# Patient Record
Sex: Male | Born: 1945 | Race: Black or African American | Hispanic: No | Marital: Single | Smoking: Never smoker
Health system: Southern US, Community
[De-identification: ages and names within clinical notes are randomized; demographics above are authoritative.]

## PROBLEM LIST (undated history)

## (undated) DIAGNOSIS — N400 Enlarged prostate without lower urinary tract symptoms: Secondary | ICD-10-CM

## (undated) DIAGNOSIS — I10 Essential (primary) hypertension: Secondary | ICD-10-CM

## (undated) DIAGNOSIS — E119 Type 2 diabetes mellitus without complications: Secondary | ICD-10-CM

## (undated) DIAGNOSIS — M109 Gout, unspecified: Secondary | ICD-10-CM

## (undated) DIAGNOSIS — G20 Parkinson's disease: Secondary | ICD-10-CM

## (undated) DIAGNOSIS — G20A1 Parkinson's disease without dyskinesia, without mention of fluctuations: Secondary | ICD-10-CM

---

## 2015-07-04 DIAGNOSIS — I10 Essential (primary) hypertension: Secondary | ICD-10-CM | POA: Diagnosis present

## 2019-06-07 DIAGNOSIS — N400 Enlarged prostate without lower urinary tract symptoms: Secondary | ICD-10-CM | POA: Diagnosis present

## 2019-06-07 DIAGNOSIS — E118 Type 2 diabetes mellitus with unspecified complications: Secondary | ICD-10-CM | POA: Diagnosis present

## 2019-06-07 DIAGNOSIS — E785 Hyperlipidemia, unspecified: Secondary | ICD-10-CM | POA: Diagnosis present

## 2020-10-28 ENCOUNTER — Encounter: Payer: Self-pay | Admitting: Emergency Medicine

## 2020-10-28 ENCOUNTER — Other Ambulatory Visit: Payer: Self-pay

## 2020-10-28 ENCOUNTER — Inpatient Hospital Stay
Admission: EM | Admit: 2020-10-28 | Discharge: 2020-10-30 | DRG: 698 | Disposition: A | Discharge status: Skilled Nursing Facility | Payer: No Typology Code available for payment source | Source: Skilled Nursing Facility | Attending: Internal Medicine | Admitting: Internal Medicine

## 2020-10-28 ENCOUNTER — Emergency Department: Payer: No Typology Code available for payment source

## 2020-10-28 DIAGNOSIS — T83518A Infection and inflammatory reaction due to other urinary catheter, initial encounter: Secondary | ICD-10-CM | POA: Diagnosis present

## 2020-10-28 DIAGNOSIS — N400 Enlarged prostate without lower urinary tract symptoms: Secondary | ICD-10-CM | POA: Diagnosis present

## 2020-10-28 DIAGNOSIS — Y929 Unspecified place or not applicable: Secondary | ICD-10-CM

## 2020-10-28 DIAGNOSIS — R6520 Severe sepsis without septic shock: Secondary | ICD-10-CM | POA: Diagnosis present

## 2020-10-28 DIAGNOSIS — J189 Pneumonia, unspecified organism: Secondary | ICD-10-CM | POA: Diagnosis present

## 2020-10-28 DIAGNOSIS — A419 Sepsis, unspecified organism: Secondary | ICD-10-CM | POA: Diagnosis present

## 2020-10-28 DIAGNOSIS — E119 Type 2 diabetes mellitus without complications: Secondary | ICD-10-CM | POA: Diagnosis present

## 2020-10-28 DIAGNOSIS — Z885 Allergy status to narcotic agent status: Secondary | ICD-10-CM

## 2020-10-28 DIAGNOSIS — D72829 Elevated white blood cell count, unspecified: Secondary | ICD-10-CM

## 2020-10-28 DIAGNOSIS — Z20822 Contact with and (suspected) exposure to covid-19: Secondary | ICD-10-CM | POA: Diagnosis present

## 2020-10-28 DIAGNOSIS — R000 Tachycardia, unspecified: Secondary | ICD-10-CM

## 2020-10-28 DIAGNOSIS — E118 Type 2 diabetes mellitus with unspecified complications: Secondary | ICD-10-CM | POA: Diagnosis present

## 2020-10-28 DIAGNOSIS — M109 Gout, unspecified: Secondary | ICD-10-CM | POA: Diagnosis present

## 2020-10-28 DIAGNOSIS — G20 Parkinson's disease: Secondary | ICD-10-CM | POA: Diagnosis present

## 2020-10-28 DIAGNOSIS — N3000 Acute cystitis without hematuria: Secondary | ICD-10-CM | POA: Diagnosis present

## 2020-10-28 DIAGNOSIS — N390 Urinary tract infection, site not specified: Secondary | ICD-10-CM | POA: Diagnosis not present

## 2020-10-28 DIAGNOSIS — R0902 Hypoxemia: Secondary | ICD-10-CM | POA: Diagnosis present

## 2020-10-28 DIAGNOSIS — N179 Acute kidney failure, unspecified: Secondary | ICD-10-CM

## 2020-10-28 DIAGNOSIS — Z888 Allergy status to other drugs, medicaments and biological substances status: Secondary | ICD-10-CM

## 2020-10-28 DIAGNOSIS — E785 Hyperlipidemia, unspecified: Secondary | ICD-10-CM | POA: Diagnosis present

## 2020-10-28 DIAGNOSIS — I10 Essential (primary) hypertension: Secondary | ICD-10-CM | POA: Diagnosis present

## 2020-10-28 HISTORY — DX: Parkinson's disease without dyskinesia, without mention of fluctuations: G20.A1

## 2020-10-28 HISTORY — DX: Essential (primary) hypertension: I10

## 2020-10-28 HISTORY — DX: Benign prostatic hyperplasia without lower urinary tract symptoms: N40.0

## 2020-10-28 HISTORY — DX: Type 2 diabetes mellitus without complications: E11.9

## 2020-10-28 HISTORY — DX: Gout, unspecified: M10.9

## 2020-10-28 HISTORY — DX: Parkinson's disease: G20

## 2020-10-28 LAB — BASIC METABOLIC PANEL
Anion gap: 9 (ref 5–15)
BUN: 22 mg/dL (ref 8–23)
CO2: 23 mmol/L (ref 22–32)
Calcium: 9.8 mg/dL (ref 8.9–10.3)
Chloride: 106 mmol/L (ref 98–111)
Creatinine, Ser: 1.58 mg/dL — ABNORMAL HIGH (ref 0.61–1.24)
GFR, Estimated: 45 mL/min — ABNORMAL LOW (ref >60)
Glucose, Bld: 207 mg/dL — ABNORMAL HIGH (ref 70–99)
Potassium: 4 mmol/L (ref 3.5–5.1)
Sodium: 138 mmol/L (ref 135–145)

## 2020-10-28 LAB — URINALYSIS, COMPLETE (UACMP) WITH MICROSCOPIC
Bacteria, UA: NONE SEEN
Bilirubin Urine: NEGATIVE
Glucose, UA: NEGATIVE mg/dL
Ketones, ur: NEGATIVE mg/dL
Nitrite: NEGATIVE
Protein, ur: 30 mg/dL — AB
Specific Gravity, Urine: 1.018 (ref 1.005–1.030)
Squamous Epithelial / HPF: NONE SEEN (ref 0–5)
WBC, UA: >50 WBC/hpf — ABNORMAL HIGH (ref 0–5)
pH: 5 (ref 5.0–8.0)

## 2020-10-28 LAB — TROPONIN I (HIGH SENSITIVITY)
Troponin I (High Sensitivity): 28 ng/L — ABNORMAL HIGH (ref <18)
Troponin I (High Sensitivity): 27 ng/L — ABNORMAL HIGH (ref <18)

## 2020-10-28 LAB — CBG MONITORING, ED
Glucose-Capillary: 251 mg/dL — ABNORMAL HIGH (ref 70–99)
Glucose-Capillary: 178 mg/dL — ABNORMAL HIGH (ref 70–99)

## 2020-10-28 LAB — CREATININE, SERUM
Creatinine, Ser: 1.67 mg/dL — ABNORMAL HIGH (ref 0.61–1.24)
GFR, Estimated: 42 mL/min — ABNORMAL LOW (ref >60)

## 2020-10-28 LAB — CBC
HCT: 48.9 % (ref 39.0–52.0)
Hemoglobin: 15.4 g/dL (ref 13.0–17.0)
MCH: 27.7 pg (ref 26.0–34.0)
MCHC: 31.5 g/dL (ref 30.0–36.0)
MCV: 88.1 fL (ref 80.0–100.0)
Platelets: 129 10*3/uL — ABNORMAL LOW (ref 150–400)
RBC: 5.55 MIL/uL (ref 4.22–5.81)
RDW: 15.5 % (ref 11.5–15.5)
WBC: 17 10*3/uL — ABNORMAL HIGH (ref 4.0–10.5)
nRBC: 0.1 % (ref 0.0–0.2)

## 2020-10-28 LAB — CBC WITH DIFFERENTIAL/PLATELET
Abs Immature Granulocytes: 0.19 10*3/uL — ABNORMAL HIGH (ref 0.00–0.07)
Basophils Absolute: 0 10*3/uL (ref 0.0–0.1)
Basophils Relative: 0 %
Eosinophils Absolute: 0 10*3/uL (ref 0.0–0.5)
Eosinophils Relative: 0 %
HCT: 45.9 % (ref 39.0–52.0)
Hemoglobin: 14.6 g/dL (ref 13.0–17.0)
Immature Granulocytes: 1 %
Lymphocytes Relative: 4 %
Lymphs Abs: 0.9 10*3/uL (ref 0.7–4.0)
MCH: 27.4 pg (ref 26.0–34.0)
MCHC: 31.8 g/dL (ref 30.0–36.0)
MCV: 86.1 fL (ref 80.0–100.0)
Monocytes Absolute: 1.7 10*3/uL — ABNORMAL HIGH (ref 0.1–1.0)
Monocytes Relative: 8 %
Neutro Abs: 19.7 10*3/uL — ABNORMAL HIGH (ref 1.7–7.7)
Neutrophils Relative %: 87 %
Platelets: 157 10*3/uL (ref 150–400)
RBC: 5.33 MIL/uL (ref 4.22–5.81)
RDW: 15.2 % (ref 11.5–15.5)
WBC: 22.6 10*3/uL — ABNORMAL HIGH (ref 4.0–10.5)
nRBC: 0 % (ref 0.0–0.2)

## 2020-10-28 LAB — LACTIC ACID, PLASMA
Lactic Acid, Venous: 1.8 mmol/L (ref 0.5–1.9)
Lactic Acid, Venous: 2.3 mmol/L (ref 0.5–1.9)
Lactic Acid, Venous: 3.5 mmol/L (ref 0.5–1.9)

## 2020-10-28 LAB — RESP PANEL BY RT-PCR (FLU A&B, COVID) ARPGX2
Influenza A by PCR: NEGATIVE
Influenza B by PCR: NEGATIVE
SARS Coronavirus 2 by RT PCR: NEGATIVE

## 2020-10-28 LAB — PROCALCITONIN: Procalcitonin: 0.83 ng/mL

## 2020-10-28 MED ORDER — ENOXAPARIN SODIUM 60 MG/0.6ML ~~LOC~~ SOLN
0.5000 mg/kg | SUBCUTANEOUS | Status: DC
  Administered 2020-10-28 – 2020-10-29 (×2): 60 mg via SUBCUTANEOUS
  Filled 2020-10-28 (×2): qty 0.6

## 2020-10-28 MED ORDER — LACTATED RINGERS IV BOLUS
1000.0000 mL | Freq: Once | INTRAVENOUS | Status: AC
  Administered 2020-10-28: 1000 mL via INTRAVENOUS

## 2020-10-28 MED ORDER — ACETAMINOPHEN 650 MG RE SUPP: 650.0000 mg | Freq: Four times a day (QID) | RECTAL | Status: DC | PRN

## 2020-10-28 MED ORDER — SODIUM CHLORIDE 0.9 % IV SOLN
1.0000 g | INTRAVENOUS | Status: DC
  Administered 2020-10-28 – 2020-10-29 (×2): 1 g via INTRAVENOUS
  Filled 2020-10-28: qty 10
  Filled 2020-10-28: qty 1
  Filled 2020-10-28: qty 10

## 2020-10-28 MED ORDER — INSULIN ASPART 100 UNIT/ML ~~LOC~~ SOLN: 0.0000 [IU] | Freq: Every day | SUBCUTANEOUS | Status: DC

## 2020-10-28 MED ORDER — INSULIN ASPART 100 UNIT/ML ~~LOC~~ SOLN
0.0000 [IU] | Freq: Three times a day (TID) | SUBCUTANEOUS | Status: DC
  Administered 2020-10-29 (×2): 3 [IU] via SUBCUTANEOUS
  Administered 2020-10-30: 2 [IU] via SUBCUTANEOUS
  Filled 2020-10-28 (×3): qty 1

## 2020-10-28 MED ORDER — ONDANSETRON HCL 4 MG PO TABS: 4.0000 mg | ORAL_TABLET | Freq: Four times a day (QID) | ORAL | Status: DC | PRN

## 2020-10-28 MED ORDER — SODIUM CHLORIDE 0.9 % IV SOLN
2.0000 g | Freq: Once | INTRAVENOUS | Status: AC
  Administered 2020-10-28: 2 g via INTRAVENOUS
  Filled 2020-10-28: qty 2

## 2020-10-28 MED ORDER — SODIUM CHLORIDE 0.9 % IV SOLN
500.0000 mg | INTRAVENOUS | Status: DC
  Administered 2020-10-28 – 2020-10-29 (×2): 500 mg via INTRAVENOUS
  Filled 2020-10-28 (×3): qty 500

## 2020-10-28 MED ORDER — ACETAMINOPHEN 325 MG PO TABS: 650.0000 mg | ORAL_TABLET | Freq: Four times a day (QID) | ORAL | Status: DC | PRN

## 2020-10-28 MED ORDER — VANCOMYCIN HCL IN DEXTROSE 1-5 GM/200ML-% IV SOLN
1000.0000 mg | Freq: Once | INTRAVENOUS | Status: AC
  Administered 2020-10-28: 1000 mg via INTRAVENOUS
  Filled 2020-10-28: qty 200

## 2020-10-28 MED ORDER — ONDANSETRON HCL 4 MG/2ML IJ SOLN: 4.0000 mg | Freq: Four times a day (QID) | INTRAMUSCULAR | Status: DC | PRN

## 2020-10-28 MED ORDER — LACTATED RINGERS IV SOLN: INTRAVENOUS | Status: AC

## 2020-10-28 NOTE — ED Notes (Signed)
Pt lactic 2.3. Derrill Kay, MD notified.

## 2020-10-28 NOTE — Progress Notes (Signed)
PHARMACIST - PHYSICIAN COMMUNICATION  CONCERNING:  Enoxaparin (Lovenox) for DVT Prophylaxis    RECOMMENDATION: Patient was prescribed enoxaprin 40mg  q24 hours for VTE prophylaxis.   Filed Weights   10/28/20 1529  Weight: 117.9 kg (260 lb)    Body mass index is 32.5 kg/m.  Estimated Creatinine Clearance: 55.1 mL/min (A) (by C-G formula based on SCr of 1.58 mg/dL (H)).   Based on [facility] policy patient is candidate for enoxaparin 0.5mg /kg TBW SQ every 24 hours based on BMI being >30.  DESCRIPTION: Pharmacy has adjusted enoxaparin dose per [facility] policy.  Patient is now receiving enoxaparin 0.5 mg/kg  every 24 hours   [facility], PharmD, [facility] 10/28/2020 10:05 PM

## 2020-10-28 NOTE — ED Notes (Signed)
Pt bed linen and briefs changed.

## 2020-10-28 NOTE — Progress Notes (Signed)
Following for Code Sepsis  Notified provider of need to order repeat lactic acid.

## 2020-10-28 NOTE — Progress Notes (Signed)
CODE SEPSIS - PHARMACY COMMUNICATION  **Broad Spectrum Antibiotics should be administered within 1 hour of Sepsis diagnosis**  Time Code Sepsis Called/Page Received: 1937  Antibiotics Ordered: vancomycin, cefepime  Time of 1st antibiotic administration: 1658  Additional action taken by pharmacy: n/a  If necessary, Name of Provider/Nurse Contacted: Ian Bushman    Sharen Hones ,PharmD Clinical Pharmacist  10/28/2020  8:00 PM

## 2020-10-28 NOTE — ED Triage Notes (Signed)
Pt to ED via EMS from [facility]. Per EMS, facility stated that pt 02 sat was 87% on room air. EMS states that pt was 96% on room air. EMS vitals; BP 112/68, HR 120s, CBG 202. Upon arrival pt is sleeping but pt is able to answer questions when aroused. Pt is also very diaphoretic. Pt 02 sat is now 95% room air

## 2020-10-28 NOTE — H&P (Signed)
History and Physical    Richard Watson KJI:[id number] DOB: [birth year] DOA: 10/28/2020  PCP: Pcp, No   Patient coming from: nursing home  I have personally briefly reviewed patient's old medical records in [facility]  Chief Complaint: Low O2 sat  HPI: Richard Watson is a 75 y.o. male with medical history significant for HTN,  BPH, and gout, sent from [facility] with altered mental status and O2 sat of 87% at the nursing home.  Patient was initially somnolent on arrival but at the time of my exam was able to answer questions fully.  States he was in his usual state of health until 3 days ago when he developed generalized malaise a nonproductive cough and shortness of breath, nausea and decreased appetite.   He denies chest pain fever or chills.  Denies vomiting, abdominal pain or diarrhea.   ED Course: On arrival, tachycardic at 119 with BP 102/73, temp 99.2, respirations 18 with O2 sat 93% on room air.  Blood work significant for WBC of 22,000, lactic acid 1.8>2.3.  BMP with creatinine 1.58 and GFR of 45 down from baseline GFR of 68 from a year ago.  Troponin 28>27.Marland Kitchen  Procalcitonin 0.83 urinalysis: Urine cloudy with large leukocytes over 50 WBCs per hpf no bacteria. EKG as reviewed by me : Sinus tachycardia at 124 with nonspecific ST-T wave changes Imaging: Chest x-ray:Areas of mild interstitial thickening may represent a degree of chronic bronchitis. No edema or airspace opacity. Cardiac silhouette within normal limits  Review of Systems: As per HPI otherwise all other systems on review of systems negative.    Past Medical History:  Diagnosis Date  . BPH (benign prostatic hyperplasia)   . DM (diabetes mellitus) (HCC)   . Gout   . HTN (hypertension)   . Parkinson's disease (HCC)     History reviewed. No pertinent surgical history.   has no history on file for tobacco use, alcohol use, and drug use.  Allergies  Allergen Reactions  . Darvon [Propoxyphene]   .  Hydrocodone-Acetaminophen     History reviewed. No pertinent family history.    Prior to Admission medications   Not on File    Physical Exam: Vitals:   10/28/20 1529 10/28/20 1630 10/28/20 1900 10/28/20 2100  BP:  104/65 105/63 100/66  Pulse:  (!) 102 93 85  Resp:  (!) 22 (!) 22 (!) 23  Temp:      TempSrc:      SpO2:  93% 95% 97%  Weight: 117.9 kg     Height: 6\' 3"  (1.905 m)        Vitals:   10/28/20 1529 10/28/20 1630 10/28/20 1900 10/28/20 2100  BP:  104/65 105/63 100/66  Pulse:  (!) 102 93 85  Resp:  (!) 22 (!) 22 (!) 23  Temp:      TempSrc:      SpO2:  93% 95% 97%  Weight: 117.9 kg     Height: 6\' 3"  (1.905 m)         Constitutional:  Weak and ill-appearing but oriented x3.  Conversational dyspnea  HEENT:      Head: Normocephalic and atraumatic.         Eyes: PERLA, EOMI, Conjunctivae are normal. Sclera is non-icteric.       Mouth/Throat: Mucous membranes are moist.       Neck: Supple with no signs of meningismus. Cardiovascular:  Tachycardic. No murmurs, gallops, or rubs. 2+ symmetrical distal pulses are present . No  JVD. No LE edema Respiratory: Respiratory effort increased.Lungs sounds diminished bilaterally. No wheezes, crackles, or rhonchi.  Gastrointestinal: Soft, non tender, and non distended with positive bowel sounds.  Genitourinary: No CVA tenderness. Musculoskeletal: Nontender with normal range of motion in all extremities. No cyanosis, or erythema of extremities. Neurologic:  Face is symmetric. Moving all extremities. No gross focal neurologic deficits . Skin: Skin is warm, dry.  No rash or ulcers Psychiatric: Mood and affect are normal    Labs on Admission: I have personally reviewed following labs and imaging studies  CBC: Recent Labs  Lab 10/28/20 1533  WBC 22.6*  NEUTROABS 19.7*  HGB 14.6  HCT 45.9  MCV 86.1  PLT 157   Basic Metabolic Panel: Recent Labs  Lab 10/28/20 1533  NA 138  K 4.0  CL 106  CO2 23  GLUCOSE 207*  BUN  22  CREATININE 1.58*  CALCIUM 9.8   GFR: Estimated Creatinine Clearance: 55.1 mL/min (A) (by C-G formula based on SCr of 1.58 mg/dL (H)). Liver Function Tests: No results for input(s): AST, ALT, ALKPHOS, BILITOT, PROT, ALBUMIN in the last 168 hours. No results for input(s): LIPASE, AMYLASE in the last 168 hours. No results for input(s): AMMONIA in the last 168 hours. Coagulation Profile: No results for input(s): INR, PROTIME in the last 168 hours. Cardiac Enzymes: No results for input(s): CKTOTAL, CKMB, CKMBINDEX, TROPONINI in the last 168 hours. BNP (last 3 results) No results for input(s): PROBNP in the last 8760 hours. HbA1C: No results for input(s): HGBA1C in the last 72 hours. CBG: Recent Labs  Lab 10/28/20 1840  GLUCAP 251*   Lipid Profile: No results for input(s): CHOL, HDL, LDLCALC, TRIG, CHOLHDL, LDLDIRECT in the last 72 hours. Thyroid Function Tests: No results for input(s): TSH, T4TOTAL, FREET4, T3FREE, THYROIDAB in the last 72 hours. Anemia Panel: No results for input(s): VITAMINB12, FOLATE, FERRITIN, TIBC, IRON, RETICCTPCT in the last 72 hours. Urine analysis:    Component Value Date/Time   COLORURINE AMBER (A) 10/28/2020 1718   APPEARANCEUR CLOUDY (A) 10/28/2020 1718   LABSPEC 1.018 10/28/2020 1718   PHURINE 5.0 10/28/2020 1718   GLUCOSEU NEGATIVE 10/28/2020 1718   HGBUR LARGE (A) 10/28/2020 1718   BILIRUBINUR NEGATIVE 10/28/2020 1718   KETONESUR NEGATIVE 10/28/2020 1718   PROTEINUR 30 (A) 10/28/2020 1718   NITRITE NEGATIVE 10/28/2020 1718   LEUKOCYTESUR LARGE (A) 10/28/2020 1718    Radiological Exams on Admission: DG Chest Portable 1 View  Result Date: 10/28/2020 CLINICAL DATA:  Shortness of breath EXAM: PORTABLE CHEST 1 VIEW COMPARISON:  April 01, 2010 FINDINGS: There is mild interstitial thickening in the mid and lower lung regions. There is no edema or consolidation. Heart size and pulmonary vascularity are normal. No adenopathy. No bone lesions.  IMPRESSION: Areas of mild interstitial thickening may represent a degree of chronic bronchitis. No edema or airspace opacity. Cardiac silhouette within normal limits. Electronically Signed   By: Bretta Bang III M.D.   On: 10/28/2020 15:54     Assessment/Plan 75 year old male with history of HTN,  BPH, and gout presenting with a 3-day history of cough, shortness of breath and generalized malaise with reported O2 sat of 87% at the nursing home.  Severe Sepsis Community-acquired pneumonia Possible UTI -Sepsis criteria include temp 99.2, tachycardia of 119 with soft BP of 102/73, hypoxia, somnolence on arrival, WBC 22,000, lactic acid 2.3>3.5, .AKI.  Patient with cough shortness of breath though chest x-ray appears nonacute.  Procalcitonin 0.83.  Urinalysis abnormal -Received IV fluid  bolus in the emergency room -Continue sepsis fluids -IV Rocephin and azithromycin for suspected CAP -IV Rocephin will also treat possible UTI -Follow urine and blood cultures  Hypoxia -Patient was reportedly hypoxic at the nursing facility but was saturating in the high 90s on room air on arrival, but was tachypneic with increased work of breathing and speaking in short sentences -Supplemental oxygen if needed    AKI (acute kidney injury) (HCC) -Creatinine 1.58 with a GFR of 45.  Last GFR on record on Care Everywhere was 69 a year ago -Likely related to severe sepsis and decreased oral intake in the past few days -IV hydration and monitor and avoid nephrotoxins    Essential hypertension -Hold home antihypertensives due to soft blood pressures and resume as appropriate  Gout -Not acutely flared    BPH (benign prostatic hyperplasia) -Continue home meds pending med rec  DVT prophylaxis: Lovenox  Code Status: full code  Family Communication:  none  Disposition Plan: Back to previous home environment Consults called: none  Status:At the time of admission, it appears that the appropriate admission  status for this patient is INPATIENT. This is judged to be reasonable and necessary in order to provide the required intensity of service to ensure the patient's safety given the presenting symptoms, physical exam findings, and initial radiographic and laboratory data in the context of their  Comorbid conditions.   Patient requires inpatient status due to high intensity of service, high risk for further deterioration and high frequency of surveillance required.   I certify that at the point of admission it is my clinical judgment that the patient will require inpatient hospital care spanning beyond 2 midnights     Andris Baumann MD [facility]     10/28/2020, 9:57 PM

## 2020-10-28 NOTE — ED Provider Notes (Signed)
[facility] Emergency Department Provider Note   ____________________________________________   I have reviewed the triage vital signs and the nursing notes.   HISTORY  Chief Complaint Shortness of Breath   History limited by: Altered Mental Status   HPI Richard Watson is a 75 y.o. male who presents to the emergency department today from living facility because of concern for hypoxia. The patient himself cannot give any significant history but is able to answer some yes or no questions. The patient states only pain is in left shoulder.  Denies any shortness of breath. Per report was found to be hypoxic to 87% at the living facility.   Records reviewed. Per medical record review patient has a history of left shoulder surgery.   Prior to Admission medications   Not on File    Allergies Patient has no allergy information on record.  No family history on file.  Social History    Review of Systems Constitutional: No fever/chills Eyes: No visual changes. ENT: No sore throat. Cardiovascular: Denies chest pain. Respiratory: Denies shortness of breath. Gastrointestinal: No abdominal pain.  No nausea, no vomiting.  No diarrhea.   Genitourinary: Negative for dysuria. Musculoskeletal: Positive for left shoulder pain. Skin: Negative for rash. Neurological: Negative for headaches, focal weakness or numbness.  ____________________________________________   PHYSICAL EXAM:  VITAL SIGNS: ED Triage Vitals  Enc Vitals Group     BP 102/73     Pulse 119     Resp 18     Temp 99.2     Temp src      SpO2 93    Constitutional: Awake and alert. Not completely oriented.  Eyes: Conjunctivae are normal.  ENT      Head: Normocephalic and atraumatic.      Nose: No congestion/rhinnorhea.      Mouth/Throat: Mucous membranes are moist.      Neck: No stridor. Hematological/Lymphatic/Immunilogical: No cervical lymphadenopathy. Cardiovascular: Tachycardic,  regular rhythm.  No murmurs, rubs, or gallops.  Respiratory: Normal respiratory effort without tachypnea nor retractions. Breath sounds are clear and equal bilaterally. No wheezes/rales/rhonchi. Gastrointestinal: Soft and non tender. No rebound. No guarding.  Genitourinary: Deferred Musculoskeletal: Normal range of motion in all extremities. No lower extremity edema. Neurologic:  Awake and alert. Answering only in yes/no. Not completely oriented.  Skin:  Skin is warm, dry and intact. No rash noted. Psychiatric: Mood and affect are normal. Speech and behavior are normal. Patient exhibits appropriate insight and judgment.  ____________________________________________    LABS (pertinent positives/negatives)  Trop hs 28 Lactic acid 1.8 BMP na 138, k 4.0, glu 207, cr 1.58 CBC wbc 22.6, hgb 14.6, plt 157 Procalcitonin 0.83 UA cloudy, large hgb dipstick, large leukocytes, >50 WBC ____________________________________________   EKG  I, Phineas Semen, attending physician, personally viewed and interpreted this EKG  EKG Time: 1519 Rate: 124 Rhythm: sinus tachycardia Axis: left axis deviation Intervals: qtc 470 QRS: narrow, q waves III, V1 ST changes: no st elevation Impression: abnormal ekg   ____________________________________________    RADIOLOGY  CXR Areas of mild interstitial thickening. No edema or airspace opacity.   ____________________________________________   PROCEDURES  Procedures  CRITICAL CARE Performed by: Phineas Semen   Total critical care time: 35 minutes  Critical care time was exclusive of separately billable procedures and treating other patients.  Critical care was necessary to treat or prevent imminent or life-threatening deterioration.  Critical care was time spent personally by me on the following activities: development of treatment plan with patient  and/or surrogate as well as nursing, discussions with consultants, evaluation of  patient's response to treatment, examination of patient, obtaining history from patient or surrogate, ordering and performing treatments and interventions, ordering and review of laboratory studies, ordering and review of radiographic studies, pulse oximetry and re-evaluation of patient's condition.  ____________________________________________   INITIAL IMPRESSION / ASSESSMENT AND PLAN / ED COURSE  Pertinent labs & imaging results that were available during my care of the patient were reviewed by me and considered in my medical decision making (see chart for details).   Patient presented to the emergency department today from living facility because of concerns for an episode of hypoxia.  Patient was not found to be hypoxic here in the emergency department he however was tachycardic.  Blood work did show an elevated white blood cell count thus raising concerns for possible infection and sepsis.  Chest x-ray was performed which did not show any obvious pneumonia.  Patient was started on IV fluids and broad-spectrum antibiotics.  Urine does have some findings concerning for urinary tract infection.  Will plan on admission to the hospital service.  ____________________________________________   FINAL CLINICAL IMPRESSION(S) / ED DIAGNOSES  Final diagnoses:  Tachycardia  Leukocytosis, unspecified type  Lower urinary tract infection     Note: This dictation was prepared with Dragon dictation. Any transcriptional errors that result from this process are unintentional     Phineas Semen, MD 10/28/20 [phone]

## 2020-10-29 DIAGNOSIS — A419 Sepsis, unspecified organism: Secondary | ICD-10-CM | POA: Diagnosis present

## 2020-10-29 DIAGNOSIS — N390 Urinary tract infection, site not specified: Secondary | ICD-10-CM

## 2020-10-29 DIAGNOSIS — R6520 Severe sepsis without septic shock: Secondary | ICD-10-CM

## 2020-10-29 DIAGNOSIS — J189 Pneumonia, unspecified organism: Secondary | ICD-10-CM

## 2020-10-29 LAB — GLUCOSE, CAPILLARY
Glucose-Capillary: 94 mg/dL (ref 70–99)
Glucose-Capillary: 163 mg/dL — ABNORMAL HIGH (ref 70–99)
Glucose-Capillary: 153 mg/dL — ABNORMAL HIGH (ref 70–99)
Glucose-Capillary: 162 mg/dL — ABNORMAL HIGH (ref 70–99)

## 2020-10-29 LAB — BASIC METABOLIC PANEL
Anion gap: 7 (ref 5–15)
BUN: 23 mg/dL (ref 8–23)
CO2: 26 mmol/L (ref 22–32)
Calcium: 9.4 mg/dL (ref 8.9–10.3)
Chloride: 107 mmol/L (ref 98–111)
Creatinine, Ser: 1.34 mg/dL — ABNORMAL HIGH (ref 0.61–1.24)
GFR, Estimated: 55 mL/min — ABNORMAL LOW (ref >60)
Glucose, Bld: 128 mg/dL — ABNORMAL HIGH (ref 70–99)
Potassium: 4.1 mmol/L (ref 3.5–5.1)
Sodium: 140 mmol/L (ref 135–145)

## 2020-10-29 LAB — CBC
HCT: 39.7 % (ref 39.0–52.0)
Hemoglobin: 12.7 g/dL — ABNORMAL LOW (ref 13.0–17.0)
MCH: 28 pg (ref 26.0–34.0)
MCHC: 32 g/dL (ref 30.0–36.0)
MCV: 87.4 fL (ref 80.0–100.0)
Platelets: 127 10*3/uL — ABNORMAL LOW (ref 150–400)
RBC: 4.54 MIL/uL (ref 4.22–5.81)
RDW: 15.4 % (ref 11.5–15.5)
WBC: 18.6 10*3/uL — ABNORMAL HIGH (ref 4.0–10.5)
nRBC: 0 % (ref 0.0–0.2)

## 2020-10-29 LAB — HEMOGLOBIN A1C
Hgb A1c MFr Bld: 8.1 % — ABNORMAL HIGH (ref 4.8–5.6)
Mean Plasma Glucose: 185.77 mg/dL

## 2020-10-29 LAB — PROTIME-INR
INR: 1.2 (ref 0.8–1.2)
Prothrombin Time: 14.9 seconds (ref 11.4–15.2)

## 2020-10-29 LAB — LACTIC ACID, PLASMA
Lactic Acid, Venous: 2 mmol/L (ref 0.5–1.9)
Lactic Acid, Venous: 2.2 mmol/L (ref 0.5–1.9)
Lactic Acid, Venous: 2.9 mmol/L (ref 0.5–1.9)

## 2020-10-29 LAB — PROCALCITONIN: Procalcitonin: 1.5 ng/mL

## 2020-10-29 LAB — CORTISOL-AM, BLOOD: Cortisol - AM: 17.3 ug/dL (ref 6.7–22.6)

## 2020-10-29 MED ORDER — TAMSULOSIN HCL 0.4 MG PO CAPS
0.8000 mg | ORAL_CAPSULE | Freq: Every day | ORAL | Status: DC
  Administered 2020-10-29: 17:00:00 0.8 mg via ORAL
  Filled 2020-10-29: qty 2

## 2020-10-29 MED ORDER — CHLORHEXIDINE GLUCONATE CLOTH 2 % EX PADS
6.0000 | MEDICATED_PAD | Freq: Every day | CUTANEOUS | Status: DC
  Administered 2020-10-29 – 2020-10-30 (×2): 6 via TOPICAL

## 2020-10-29 MED ORDER — ASPIRIN EC 81 MG PO TBEC
81.0000 mg | DELAYED_RELEASE_TABLET | Freq: Every day | ORAL | Status: DC
  Administered 2020-10-29 – 2020-10-30 (×2): 81 mg via ORAL
  Filled 2020-10-29 (×2): qty 1

## 2020-10-29 MED ORDER — MIRTAZAPINE 7.5 MG PO TABS
7.5000 mg | ORAL_TABLET | Freq: Every day | ORAL | Status: DC
  Administered 2020-10-29: 22:00:00 7.5 mg via ORAL
  Filled 2020-10-29: qty 1

## 2020-10-29 NOTE — Progress Notes (Addendum)
Admitted patient from the ED, transported via stretcher, accompanied by RN. VSS, denies any pain. Placed on telemetry. IVF infusing. Fluids offered. Pt arrived to room with an indwelling cathete. Records from facility stated  That pt has a chronic foley for urinary retention and arrived in ED with catheter. Emptied 300 ml of amber urine. Call bell placed within reach. Orientation given. Bed alarm activated.

## 2020-10-29 NOTE — ED Notes (Signed)
Lactic acid is 2.9, Para March MD sent secure chat to make aware.

## 2020-10-29 NOTE — ED Notes (Signed)
Secure chat sent to IP nurse Belinda.

## 2020-10-29 NOTE — TOC Initial Note (Signed)
Transition of Care [facility]) - Initial/Assessment Note    Patient Details  Name: Richard Watson MRN: [id number] Date of Birth: 24-Oct-1945  [facility] of [facility] CM/SW Contact:    Richard Hutching, RN Phone Number: 10/29/2020, 4:14 PM  Clinical Narrative:                 Patient admitted to the hospital with sepsis.  RNCM met with patient at the bedside and patient states that he lives in [place] but unable to give address, he goes to the [facility], per nurse patient is from [place] verified this with patient's son, Richard Watson.  Richard Watson that patient has been living at [facility] for the past month and a half, before that he was at ALF for about 2 months.  [facility]  Plan for Watson is for the patient to return.   Son Watson that patient is bed bound and has a chronic foley.  TOC will cont to follow.   Expected Watson Plan: Richard Watson: Continued Medical Work up   Patient Goals and CMS Choice Patient states their goals for this hospitalization and ongoing recovery are:: Plan will be for patient to return to [facility] [employer].gov Compare Post Acute Care list provided to:: Patient Represenative (must comment) Choice offered to / list presented to : Adult Children  Expected Watson Plan and Services Expected Watson Plan: Richard Watson   Watson Planning Services: CM Consult Post Acute Care Choice: Richard Watson Living arrangements for the past 2 months: Richard Watson                 DME Arranged: N/A DME Agency: NA       HH Arranged: NA          Prior Living Arrangements/Services Living arrangements for the past 2 months: Richard Watson Lives with:: Facility Resident Patient language and need for interpreter reviewed:: Yes Do you feel safe going back to the place where you live?: Yes      Need for Family Participation  in Patient Care: Yes (Comment) Care giver support system in place?: Yes (comment) (son)   Criminal Activity/Legal Involvement Pertinent to Current Situation/Hospitalization: No - Comment as needed  Activities of Daily Living Home Assistive Devices/Equipment: Environmental consultant (specify type) ADL Screening (condition at time of admission) Patient's cognitive ability adequate to safely complete daily activities?: Yes Is the patient deaf or have difficulty hearing?: No Does the patient have difficulty seeing, even when wearing glasses/contacts?: No Does the patient have difficulty concentrating, remembering, or making decisions?: No Patient able to express need for assistance with ADLs?: Yes Does the patient have difficulty dressing or bathing?: Yes Independently performs ADLs?: No Communication: Independent Dressing (OT): Needs assistance Grooming: Needs assistance Feeding: Needs assistance Bathing: Needs assistance Toileting: Needs assistance In/Out Bed: Needs assistance Walks in Home: Needs assistance Does the patient have difficulty walking or climbing stairs?: Yes Weakness of Legs: Both Weakness of Arms/Hands: Both  Permission Sought/Granted Permission sought to share information with : Case Manager,Family Chief Financial Officer Permission granted to share information with : Yes, Verbal Permission Granted  Share Information with NAME: Richard Watson (son)  Permission granted to share info w AGENCY: Richard Watson granted to share info w Relationship: son  Permission granted to share info w Contact Information: [phone]  Emotional Assessment Appearance:: Appears stated age Attitude/Demeanor/Rapport: Engaged Affect (  typically observed): Accepting Orientation: : Oriented to Self,Oriented to Place Alcohol / Substance Use: Not Applicable Psych Involvement: No (comment)  Admission diagnosis:  Tachycardia [R00.0] Lower urinary tract infection  [N39.0] Sepsis secondary to UTI (Richard Watson) [A41.9, N39.0] Leukocytosis, unspecified type [D72.829] Patient Active Problem List   Diagnosis Date Noted  . CAP (community acquired pneumonia) 10/29/2020  . UTI (urinary tract infection) 10/29/2020  . Sepsis secondary to UTI (Richard Watson) 10/29/2020  . Severe sepsis (Richard Watson) 10/28/2020  . AKI (acute kidney injury) (Richard Watson) 10/28/2020  . BPH (benign prostatic hyperplasia) 06/07/2019  . Essential hypertension 07/04/2015   PCP:  Pcp, No Pharmacy:  No Pharmacies Listed    Social Determinants of Health (SDOH) Interventions    Readmission Risk Interventions No flowsheet data found.

## 2020-10-29 NOTE — ED Notes (Signed)
Lactic Acid: 2.2, Para March MD sent secure chat to make aware.

## 2020-10-29 NOTE — [place] FL2 (Signed)
[place] MEDICAID FL2 LEVEL OF CARE SCREENING TOOL     IDENTIFICATION  Patient Name: Richard Watson Birthdate: [birth year] Sex: male Admission Date (Current Location): 10/28/2020  [place] and [place] Number:  Chiropodist and Address:  [facility], [address] [place], [place] [address]      Provider Number: [id number]  Attending Physician Name and Address:  Enedina Finner, MD  Relative Name and Phone Number:  Anoop Hemmer (son) [phone]    Current Level of Care: Hospital Recommended Level of Care: Skilled Nursing Facility Prior Approval Number:    Date Approved/Denied:   PASRR Number:    Discharge Plan: SNF    Current Diagnoses: Patient Active Problem List   Diagnosis Date Noted  . CAP (community acquired pneumonia) 10/29/2020  . UTI (urinary tract infection) 10/29/2020  . Sepsis secondary to UTI (HCC) 10/29/2020  . Severe sepsis (HCC) 10/28/2020  . AKI (acute kidney injury) (HCC) 10/28/2020  . BPH (benign prostatic hyperplasia) 06/07/2019  . Essential hypertension 07/04/2015    Orientation RESPIRATION BLADDER Height & Weight     Self,Place  Normal Indwelling catheter Weight: 113 kg Height:  6\' 3"  (190.5 cm)  BEHAVIORAL SYMPTOMS/MOOD NEUROLOGICAL BOWEL NUTRITION STATUS      Continent Diet (heart healthy/ carb modified)  AMBULATORY STATUS COMMUNICATION OF NEEDS Skin   Extensive Assist Verbally Normal                       Personal Care Assistance Level of Assistance  Bathing,Feeding,Dressing Bathing Assistance: Maximum assistance Feeding assistance: Limited assistance Dressing Assistance: Maximum assistance     Functional Limitations Info             SPECIAL CARE FACTORS FREQUENCY                       Contractures Contractures Info: Not present    Additional Factors Info  Code Status,Allergies Code Status Info: DNR Allergies Info: Darvon, hydrocodone-acetaminophen           Current  Medications (10/29/2020):  This is the current hospital active medication list Current Facility-Administered Medications  Medication Dose Route Frequency Provider Last Rate Last Admin  . acetaminophen (TYLENOL) tablet 650 mg  650 mg Oral Q6H PRN 12/29/2020, MD       Or  . acetaminophen (TYLENOL) suppository 650 mg  650 mg Rectal Q6H PRN Andris Baumann, MD      . aspirin EC tablet 81 mg  81 mg Oral Daily Andris Baumann, MD   81 mg at 10/29/20 1450  . azithromycin (ZITHROMAX) 500 mg in sodium chloride 0.9 % 250 mL IVPB  500 mg Intravenous Q24H 12/29/20, MD   Stopped at 10/29/20 [phone]  . cefTRIAXone (ROCEPHIN) 1 g in sodium chloride 0.9 % 100 mL IVPB  1 g Intravenous Q24H 3846, MD   Stopped at 10/28/20 2301  . Chlorhexidine Gluconate Cloth 2 % PADS 6 each  6 each Topical Daily 2302, MD   6 each at 10/29/20 [phone]  . enoxaparin (LOVENOX) injection 60 mg  0.5 mg/kg Subcutaneous Q24H 6599, MD   60 mg at 10/28/20 2245  . insulin aspart (novoLOG) injection 0-15 Units  0-15 Units Subcutaneous TID WC 10-22-1984, MD   3 Units at 10/29/20 1126  . insulin aspart (novoLOG) injection 0-5 Units  0-5 Units Subcutaneous QHS 12/29/20, MD      .  mirtazapine (REMERON) tablet 7.5 mg  7.5 mg Oral QHS Enedina Finner, MD      . ondansetron [facility]) tablet 4 mg  4 mg Oral Q6H PRN Andris Baumann, MD       Or  . ondansetron [facility]) injection 4 mg  4 mg Intravenous Q6H PRN Andris Baumann, MD      . tamsulosin (FLOMAX) capsule 0.8 mg  0.8 mg Oral QPC supper Enedina Finner, MD         Discharge Medications: Please see discharge summary for a list of discharge medications.  Relevant Imaging Results:  Relevant Lab Results:   Additional Information SS# [id number]  Allayne Butcher, RN

## 2020-10-29 NOTE — Progress Notes (Signed)
[facility]  - [place] [facility]   PATIENT NAME: Richard Watson    MR#:  [id number]  DATE OF BIRTH:  [birth year]  SUBJECTIVE:  patient came in from [facility] with shortness of breath cough. Was admitted with sepsis. Doing overall well hemodynamically improving. Eating very well. Denies any complaints at present.  REVIEW OF SYSTEMS:   Review of Systems  Constitutional: Negative for chills, fever and weight loss.  HENT: Negative for ear discharge, ear pain and nosebleeds.   Eyes: Negative for blurred vision, pain and discharge.  Respiratory: Positive for cough and shortness of breath. Negative for sputum production, wheezing and stridor.   Cardiovascular: Negative for chest pain, palpitations, orthopnea and PND.  Gastrointestinal: Negative for abdominal pain, diarrhea, nausea and vomiting.  Genitourinary: Negative for frequency and urgency.  Musculoskeletal: Negative for back pain and joint pain.  Neurological: Positive for weakness. Negative for sensory change, speech change and focal weakness.  Psychiatric/Behavioral: Negative for depression and hallucinations. The patient is not nervous/anxious.    Tolerating Diet:yes Tolerating PT:   DRUG ALLERGIES:   Allergies  Allergen Reactions  . Darvon [Propoxyphene]   . Hydrocodone-Acetaminophen     VITALS:  Blood pressure (!) 123/55, pulse (!) 105, temperature 98.2 F (36.8 C), resp. rate 15, height 6\' 3"  (1.905 m), weight 113 kg, SpO2 98 %.  PHYSICAL EXAMINATION:   Physical Exam  GENERAL:  75 y.o.-year-old patient lying in the bed with no acute distress. obese LUNGS: Normal breath sounds bilaterally, no wheezing, rales, rhonchi. No use of accessory muscles of respiration.  CARDIOVASCULAR: S1, S2 normal. No murmurs, rubs, or gallops.  ABDOMEN: Soft, nontender, nondistended. Bowel sounds present.Chronic Foley+  EXTREMITIES: No cyanosis, clubbing or edema b/l.    NEUROLOGIC: Cranial nerves II through XII  are intact. No focal Motor or sensory deficits b/l.   PSYCHIATRIC:  patient is alert and oriented x2 SKIN: No obvious rash, lesion, or ulcer.   LABORATORY PANEL:  CBC Recent Labs  Lab 10/29/20 0332  WBC 18.6*  HGB 12.7*  HCT 39.7  PLT 127*    Chemistries  Recent Labs  Lab 10/29/20 0609  NA 140  K 4.1  CL 107  CO2 26  GLUCOSE 128*  BUN 23  CREATININE 1.34*  CALCIUM 9.4   Cardiac Enzymes No results for input(s): TROPONINI in the last 168 hours. RADIOLOGY:  DG Chest Portable 1 View  Result Date: 10/28/2020 CLINICAL DATA:  Shortness of breath EXAM: PORTABLE CHEST 1 VIEW COMPARISON:  April 01, 2010 FINDINGS: There is mild interstitial thickening in the mid and lower lung regions. There is no edema or consolidation. Heart size and pulmonary vascularity are normal. No adenopathy. No bone lesions. IMPRESSION: Areas of mild interstitial thickening may represent a degree of chronic bronchitis. No edema or airspace opacity. Cardiac silhouette within normal limits. Electronically Signed   By: April 03, 2010 III M.D.   On: 10/28/2020 15:54   ASSESSMENT AND PLAN:   75 year old male with history of HTN,  BPH, and gout presenting with a 3-day history of cough, shortness of breath and generalized malaise with reported O2 sat of 87% at the nursing home.  Severe Sepsis--POA Community-acquired pneumonia Possible UTI with chronic indwelling catheter -Sepsis criteria include temp 99.2, tachycardia of 119 with soft BP of 102/73, hypoxia, somnolence on arrival, WBC 22,000, lactic acid 2.3>3.5> 2.0 with AKI.  --Patient with cough shortness of breath though chest x-ray appears nonacute.   --Procalcitonin 0.83.  --1.50 --Urinalysis abnormal --Received  IVF -Continue sepsis fluids -IV Rocephin and azithromycin for suspected CAP -IV Rocephin will also treat possible UTI - Blood culture negative so far -- urine culture was not sent. I ordered urine culture. Pending  results  Hypoxia--resolved -Patient was reportedly hypoxic at the nursing facility but was saturating in the high 90s on room air on arrival, but was tachypneic with increased work of breathing and speaking in short sentences -Supplemental oxygen if needed --sats 98% on RA    AKI (acute kidney injury) (HCC) -- Last GFR on record on Care Everywhere was 44 a year ago --came in with creat 1.67---1.34 -Likely related to severe sepsis and decreased oral intake in the past few days -IV hydration and monitor and avoid nephrotoxins    Essential hypertension -Hold home antihypertensives due to soft blood pressures and resume as appropriate  Gout -Not acutely flared    BPH (benign prostatic hyperplasia) - resumed flomax  DVT prophylaxis: Lovenox  Code Status: full code  Family Communication:  son Richard Watson Disposition Plan: Back to previous home environment--WOM (Long term ) Consults called: none  Status:At the time of admission, it appears that the appropriate admission status for this patient is INPATIENT Level of care: Med-Surg        TOTAL TIME TAKING CARE OF THIS PATIENT: *25* minutes.  >50% time spent on counselling and coordination of care  Note: This dictation was prepared with Dragon dictation along with smaller phrase technology. Any transcriptional errors that result from this process are unintentional.  Richard Watson M.D    [facility]   CC: Primary care physician; Pcp, NoPatient ID: Richard Watson, male   DOB: July 24, 1946, 75 y.o.   MRN: [id number]

## 2020-10-29 NOTE — ED Notes (Signed)
When cleaning pt's room in ED, this RN found DNR form in envelope under stretcher on floor. DNR form taken to floor RN Massie Bougie and Para March MD sent secure chat to make aware.

## 2020-10-30 DIAGNOSIS — A419 Sepsis, unspecified organism: Secondary | ICD-10-CM | POA: Diagnosis not present

## 2020-10-30 DIAGNOSIS — R6520 Severe sepsis without septic shock: Secondary | ICD-10-CM | POA: Diagnosis not present

## 2020-10-30 LAB — GLUCOSE, CAPILLARY
Glucose-Capillary: 113 mg/dL — ABNORMAL HIGH (ref 70–99)
Glucose-Capillary: 146 mg/dL — ABNORMAL HIGH (ref 70–99)

## 2020-10-30 LAB — LACTIC ACID, PLASMA: Lactic Acid, Venous: 0.9 mmol/L (ref 0.5–1.9)

## 2020-10-30 MED ORDER — CEPHALEXIN 500 MG PO CAPS
500.0000 mg | ORAL_CAPSULE | Freq: Three times a day (TID) | ORAL | Status: DC
  Filled 2020-10-30: qty 1

## 2020-10-30 MED ORDER — TAMSULOSIN HCL 0.4 MG PO CAPS: 0.8000 mg | ORAL_CAPSULE | Freq: Every day | ORAL | Status: DC

## 2020-10-30 MED ORDER — AZITHROMYCIN 250 MG PO TABS: 250.0000 mg | ORAL_TABLET | Freq: Every day | ORAL | Status: DC

## 2020-10-30 MED ORDER — ASPIRIN EC 81 MG PO TBEC: 81.0000 mg | DELAYED_RELEASE_TABLET | Freq: Every day | ORAL | Status: DC

## 2020-10-30 MED ORDER — CEPHALEXIN 500 MG PO CAPS: 500.0000 mg | ORAL_CAPSULE | Freq: Three times a day (TID) | ORAL | 0 refills | Status: AC

## 2020-10-30 MED ORDER — FISH OIL 1000 MG PO CAPS: 1000.0000 mg | ORAL_CAPSULE | Freq: Two times a day (BID) | ORAL | Status: DC

## 2020-10-30 MED ORDER — MIRTAZAPINE 15 MG PO TABS: 7.5000 mg | ORAL_TABLET | Freq: Every day | ORAL | Status: DC

## 2020-10-30 MED ORDER — METOPROLOL TARTRATE 25 MG PO TABS: 25.0000 mg | ORAL_TABLET | Freq: Two times a day (BID) | ORAL | Status: DC

## 2020-10-30 MED ORDER — AZITHROMYCIN 250 MG PO TABS: 250.0000 mg | ORAL_TABLET | Freq: Every day | ORAL | 0 refills | Status: AC

## 2020-10-30 MED ORDER — LISINOPRIL 5 MG PO TABS: 5.0000 mg | ORAL_TABLET | Freq: Every day | ORAL | 0 refills | Status: AC

## 2020-10-30 MED ORDER — LISINOPRIL 5 MG PO TABS
5.0000 mg | ORAL_TABLET | Freq: Every day | ORAL | Status: DC
  Filled 2020-10-30: qty 1

## 2020-10-30 MED ORDER — OMEGA-3-ACID ETHYL ESTERS 1 G PO CAPS: 1.0000 g | ORAL_CAPSULE | Freq: Two times a day (BID) | ORAL | Status: DC

## 2020-10-30 NOTE — Discharge Planning (Signed)
IVs removed.  Per 1C report - they called report to [facility] and s/w [place], LPN.  Packet complete and EMS transporting to room 313B.

## 2020-10-30 NOTE — Discharge Instructions (Signed)
Foley care per protocol  Please CHANGE Patient's FOLEY CATHETER at [facility] manner (18 Fr) today

## 2020-10-30 NOTE — Progress Notes (Signed)
Spoke with pts son Irfan Veal. Asked son about pt code status. Son states pt is a full code.

## 2020-10-30 NOTE — TOC Progression Note (Signed)
Transition of Care [facility]) - Progression Note    Patient Details  Name: Richard Watson MRN: [id number] Date of Birth: 12-Sep-1945  [facility] CM/SW Contact  Allayne Butcher, RN Phone Number: 10/30/2020, 1:33 PM  Clinical Narrative:    EMS arranged.  Patient is 2nd on the list for pick up.  [facility] will change the foley catheter out when patient returns.  Bedside RN has called report to facility.    Expected Discharge Plan: Skilled Nursing Facility Barriers to Discharge: No Barriers Identified  Expected Discharge Plan and Services Expected Discharge Plan: Skilled Nursing Facility   Discharge Planning Services: CM Consult Post Acute Care Choice: Skilled Nursing Facility Living arrangements for the past 2 months: Skilled Nursing Facility Expected Discharge Date: 10/30/20               DME Arranged: N/A DME Agency: NA       HH Arranged: NA           Social Determinants of Health (SDOH) Interventions    Readmission Risk Interventions No flowsheet data found.

## 2020-10-30 NOTE — Discharge Summary (Signed)
[facility] [place] [facility]   PATIENT NAME: Richard Watson    MR#:  [id number]  DATE OF BIRTH:  Feb 21, 1946  DATE OF ADMISSION:  10/28/2020 ADMITTING PHYSICIAN: Richard Baumann, MD  DATE OF DISCHARGE: 10/30/20  PRIMARY CARE PHYSICIAN: Richard Watson    ADMISSION DIAGNOSIS:  Tachycardia [R00.0] Lower urinary tract infection [N39.0] Sepsis secondary to UTI (HCC) [A41.9, N39.0] Leukocytosis, unspecified type [D72.829]  DISCHARGE DIAGNOSIS:  Sepsis POA--resolved UTI/Acute cystitis in the setting of indwelling catheter Pneumonia  SECONDARY DIAGNOSIS:   Past Medical History:  Diagnosis Date  . BPH (benign prostatic hyperplasia)   . DM (diabetes mellitus) (HCC)   . Gout   . HTN (hypertension)   . Parkinson's disease [facility])     HOSPITAL COURSE:   75 year old male with history of HTN, BPH, and gout presenting with a 3-day history of cough, shortness of breath and generalized malaisewith reported O2 sat of 87% at the nursing home.  SevereSepsis--POA Community-acquired pneumonia UTI with chronic indwelling catheter -Sepsis criteria include temp 99.2, tachycardia of 119with soft BP of 102/73, hypoxia, somnolence on arrival,WBC 22,000, lactic acid 2.3>3.5> 2.0> 0.8 withAKI.  --Patient with cough shortness of breath though chest x-ray appears nonacute.  --Procalcitonin 0.83. --1.50 --Urinalysis abnormal --Received IVF per sepsis prtocol -IV Rocephinand azithromycin for suspected CAP--change to oral abxs -IV Rocephin will also treat possible UTI - Blood culture negative so far -- urine culture GNR - patient afebrile, hemodynamically stable  Hypoxia--resolved -Patient was reportedly hypoxic at the nursing facility but was saturating in the high 90s on room air on arrival, but was tachypneic with increased work of breathing and speaking in short sentences -Supplemental oxygen if needed --sats 98% on RA  AKI (acute kidney injury) (HCC) --Last GFR on  record on Care Everywhere was 57 a year ago --came in with creat 1.67---1.34 -Likely related to severe sepsis and decreased oral intake in the past few days - avoid nephrotoxins  Essential hypertension - resumed  beta-blockers and lisinopril change dose to 5 mg daily  Gout -Not acutely flared  BPH (benign prostatic hyperplasia) with chronic Foley catheter - resumed flomax -- according to patient's son catheter was placed about a month ago. -- Patient will need Foley catheter change once he returns back to [facility] 18 fr size--this was conveyed to the staff at [facility] since we dont have 18Fr -- patient will need to follow-up with [facility] urology for BPH and chronic Foley for further evaluation-- [facility] to arrange for appointment.  DVT prophylaxis:Lovenox  Code Status:full code Family Communication:son Richard Watson Disposition Plan:Back to previous home environment--WOM (Long term ) Consults called:none Status INPATIENT Level of care: Med-Surg  Overall medically best at baseline for discharge. CONSULTS OBTAINED:    DRUG ALLERGIES:   Allergies  Allergen Reactions  . Darvon [Propoxyphene]   . Hydrocodone-Acetaminophen     DISCHARGE MEDICATIONS:   Allergies as of 10/30/2020      Reactions   Darvon [propoxyphene]    Hydrocodone-acetaminophen       Medication List    TAKE these medications   aspirin 81 MG EC tablet Take by mouth.   cephALEXin 500 MG capsule Commonly known as: KEFLEX Take 1 capsule (500 mg total) by mouth 3 (three) times daily for 5 days.   Fish Oil 1000 MG Caps Take 1,000 mg by mouth 2 (two) times daily.   HM Lidocaine Patch 4 % Ptch Generic drug: Lidocaine Apply 1 patch topically daily. To  left knee. 12 hours on, 12 hours off.   ipratropium-albuterol 0.5-2.5 (3) MG/3ML Soln Commonly known as: DUONEB Take 3 mLs by nebulization every 6 (six) hours as needed. For shortness of breath/congestion.   lisinopril 5 MG  tablet Commonly known as: ZESTRIL Take 1 tablet (5 mg total) by mouth daily. Start taking on: October 31, 2020 What changed:   medication strength  how much to take   metoprolol tartrate 25 MG tablet Commonly known as: LOPRESSOR Take by mouth 2 (two) times daily.   mirtazapine 7.5 MG tablet Commonly known as: REMERON Take 7.5 mg by mouth at bedtime.   nystatin powder Generic drug: nystatin Apply 1 application topically in the morning, at noon, in the evening, and at bedtime. To feet for 14 days   ondansetron 4 MG tablet Commonly known as: ZOFRAN Take 4 mg by mouth every 6 (six) hours as needed for nausea/vomiting.   tamsulosin 0.4 MG Caps capsule Commonly known as: FLOMAX Take 0.8 mg by mouth daily.       If you experience worsening of your admission symptoms, develop shortness of breath, life threatening emergency, suicidal or homicidal thoughts you must seek medical attention immediately by calling 911 or calling your MD immediately  if symptoms less severe.  You Must read complete instructions/literature along with all the possible adverse reactions/side effects for all the Medicines you take and that have been prescribed to you. Take any new Medicines after you have completely understood and accept all the possible adverse reactions/side effects.   Please note  You were cared for by a hospitalist during your hospital stay. If you have any questions about your discharge medications or the care you received while you were in the hospital after you are discharged, you can call the unit and asked to speak with the hospitalist on call if the hospitalist that took care of you is not available. Once you are discharged, your primary care physician will handle any further medical issues. Please note that Watson REFILLS for any discharge medications will be authorized once you are discharged, as it is imperative that you return to your primary care physician (or establish a relationship  with a primary care physician if you do not have one) for your aftercare needs so that they can reassess your need for medications and monitor your lab values. Today   SUBJECTIVE   Doing well. Watson fever. Tolerating PO diet  VITAL SIGNS:  Blood pressure (!) 146/74, pulse 92, temperature 99 F (37.2 C), temperature source Oral, resp. rate 18, height 6\' 3"  (1.905 m), weight 113 kg, SpO2 98 %.  I/O:    Intake/Output Summary (Last 24 hours) at 10/30/2020 1305 Last data filed at 10/30/2020 1022 Gross per 24 hour  Intake 480 ml  Output 1400 ml  Net -920 ml    PHYSICAL EXAMINATION:  GENERAL:  75 y.o.-year-old patient lying in the bed with Watson acute distress.   LUNGS: Normal breath sounds bilaterally, Watson wheezing, rales,rhonchi or crepitation. Watson use of accessory muscles of respiration.  CARDIOVASCULAR: S1, S2 normal. Watson murmurs, rubs, or gallops.  ABDOMEN: Soft, non-tender, non-distended. Bowel sounds present. Chronic Foley catheter + EXTREMITIES: Watson pedal edema, cyanosis, or clubbing.  NEUROLOGIC: Cranial nerves II through XII are intact. Muscle strength 5/5 in all extremities. Sensation intact. Gait not checked.  PSYCHIATRIC: The patient is alert and oriented x 2.  SKIN: Watson obvious rash, lesion, or ulcer.   DATA REVIEW:   CBC  Recent Labs  Lab 10/29/20  0332  WBC 18.6*  HGB 12.7*  HCT 39.7  PLT 127*    Chemistries  Recent Labs  Lab 10/29/20 0609  NA 140  K 4.1  CL 107  CO2 26  GLUCOSE 128*  BUN 23  CREATININE 1.34*  CALCIUM 9.4    Microbiology Results   Recent Results (from the past 240 hour(s))  Resp Panel by RT-PCR (Flu A&B, Covid) Nasopharyngeal Swab     Status: None   Collection Time: 10/28/20  3:33 PM   Specimen: Nasopharyngeal Swab; Nasopharyngeal(NP) swabs in vial transport medium  Result Value Ref Range Status   SARS Coronavirus 2 by RT PCR NEGATIVE NEGATIVE Final    Comment: (NOTE) SARS-CoV-2 target nucleic acids are NOT DETECTED.  The SARS-CoV-2 RNA  is generally detectable in upper respiratory specimens during the acute phase of infection. The lowest concentration of SARS-CoV-2 viral copies this assay can detect is 138 copies/mL. A negative result does not preclude SARS-Cov-2 infection and should not be used as the sole basis for treatment or other patient management decisions. A negative result may occur with  improper specimen collection/handling, submission of specimen other than nasopharyngeal swab, presence of viral mutation(s) within the areas targeted by this assay, and inadequate number of viral copies(<138 copies/mL). A negative result must be combined with clinical observations, patient history, and epidemiological information. The expected result is Negative.  Fact Sheet for Patients:  BloggerCourse.comhttps://www.fda.gov/media/152166/download  Fact Sheet for Healthcare Providers:  SeriousBroker.ithttps://www.fda.gov/media/152162/download  This test is Watson t yet approved or cleared by the [place] FDA and  has been authorized for detection and/or diagnosis of SARS-CoV-2 by FDA under an Emergency Use Authorization (EUA). This EUA will remain  in effect (meaning this test can be used) for the duration of the COVID-19 declaration under Section 564(b)(1) of the Act, 21 U.S.C.section 360bbb-3(b)(1), unless the authorization is terminated  or revoked sooner.       Influenza A by PCR NEGATIVE NEGATIVE Final   Influenza B by PCR NEGATIVE NEGATIVE Final    Comment: (NOTE) The Xpert Xpress SARS-CoV-2/FLU/RSV plus assay is intended as an aid in the diagnosis of influenza from Nasopharyngeal swab specimens and should not be used as a sole basis for treatment. Nasal washings and aspirates are unacceptable for Xpert Xpress SARS-CoV-2/FLU/RSV testing.  Fact Sheet for Patients: BloggerCourse.comhttps://www.fda.gov/media/152166/download  Fact Sheet for Healthcare Providers: SeriousBroker.ithttps://www.fda.gov/media/152162/download  This test is not yet approved or cleared by the  [place] FDA and has been authorized for detection and/or diagnosis of SARS-CoV-2 by FDA under an Emergency Use Authorization (EUA). This EUA will remain in effect (meaning this test can be used) for the duration of the COVID-19 declaration under Section 564(b)(1) of the Act, 21 U.S.C. section 360bbb-3(b)(1), unless the authorization is terminated or revoked.  Performed at [facility], [address] [place], [place] [address]   Blood culture (routine x 2)     Status: None (Preliminary result)   Collection Time: 10/28/20  3:42 PM   Specimen: BLOOD  Result Value Ref Range Status   Specimen Description BLOOD LEFT ANTECUBITAL  Final   Special Requests   Final    BOTTLES DRAWN AEROBIC AND ANAEROBIC Blood Culture adequate volume   Culture   Final    Watson GROWTH 2 DAYS Performed at [facility], [address] [place], [place] [address]    Report Status PENDING  Incomplete  Blood culture (routine x 2)     Status: None (Preliminary result)   Collection Time: 10/28/20  3:42 PM   Specimen: BLOOD  Result Value Ref Range Status   Specimen Description BLOOD BLOOD RIGHT WRIST  Final   Special Requests   Final    BOTTLES DRAWN AEROBIC AND ANAEROBIC Blood Culture adequate volume   Culture   Final    Watson GROWTH 2 DAYS Performed at [facility], [address], [place], [place] [address]    Report Status PENDING  Incomplete  Urine Culture     Status: Abnormal (Preliminary result)   Collection Time: 10/28/20  5:18 PM   Specimen: Urine, Random  Result Value Ref Range Status   Specimen Description   Final    URINE, RANDOM Performed at [facility], [address], [place], [place] [address]    Special Requests   Final    NONE Performed at [facility], [address], [place], [place] [address]    Culture (A)  Final    >=100,000 COLONIES/mL GRAM NEGATIVE RODS SUSCEPTIBILITIES TO FOLLOW CULTURE REINCUBATED FOR BETTER  GROWTH Performed at [facility], [address] [place], [place] [address]    Report Status PENDING  Incomplete    RADIOLOGY:  DG Chest Portable 1 View  Result Date: 10/28/2020 CLINICAL DATA:  Shortness of breath EXAM: PORTABLE CHEST 1 VIEW COMPARISON:  April 01, 2010 FINDINGS: There is mild interstitial thickening in the mid and lower lung regions. There is Watson edema or consolidation. Heart size and pulmonary vascularity are normal. Watson adenopathy. Watson bone lesions. IMPRESSION: Areas of mild interstitial thickening may represent a degree of chronic bronchitis. Watson edema or airspace opacity. Cardiac silhouette within normal limits. Electronically Signed   By: Bretta Bang III M.D.   On: 10/28/2020 15:54     CODE STATUS:     Code Status Orders  (From admission, onward)         Start     Ordered   10/28/20 2154  Full code  Continuous        10/28/20 2156        Code Status History    This patient has a current code status but Watson historical code status.   Advance Care Planning Activity    Advance Directive Documentation   Flowsheet Row Most Recent Value  Type of Advance Directive Living will  Pre-existing out of facility DNR order (yellow form or pink MOST form) -  "MOST" Form in Place? -       TOTAL TIME TAKING CARE OF THIS PATIENT: 35 minutes.    Enedina Finner M.D  [facility]    CC: Primary care physician; Richard Watson

## 2020-10-30 NOTE — TOC Transition Note (Signed)
Transition of Care [facility]) - CM/SW Discharge Note   Patient Details  Name: Richard Watson MRN: [id number] Date of Birth: 1946-07-17  [facility] CM/SW Contact:  Allayne Butcher, RN Phone Number: 10/30/2020, 12:47 PM   Clinical Narrative:    Patient is medically stable for discharge back to [facility] today.  Chronic foley catheter will be exchanged before he discharges.  Patient will be going to room 313 B on C wing.  Once discharge orders and catheter exchanged RNCM will arrange transportation.    Final next level of care: Skilled Nursing Facility Barriers to Discharge: No Barriers Identified   Patient Goals and CMS Choice Patient states their goals for this hospitalization and ongoing recovery are:: Plan will be for patient to return to [facility].gov Compare Post Acute Care list provided to:: Patient Represenative (must comment) Choice offered to / list presented to : Adult Children  Discharge Placement   Existing PASRR number confirmed : 10/29/20          Patient chooses bed at: [facility] Patient to be transferred to facility by: [facility] Name of family member notified: Skylar (son) Patient and family notified of of transfer: 10/30/20  Discharge Plan and Services   Discharge Planning Services: CM Consult Post Acute Care Choice: Skilled Nursing Facility          DME Arranged: N/A DME Agency: NA       HH Arranged: NA          Social Determinants of Health (SDOH) Interventions     Readmission Risk Interventions No flowsheet data found.

## 2020-10-30 NOTE — Progress Notes (Signed)
IV removed before discharge. Patient going to [facility], room 1313-B. Called report to [facility] the admitting nurse at [facility].

## 2020-11-01 LAB — URINE CULTURE: Culture: >=100000 — AB

## 2020-11-02 LAB — CULTURE, BLOOD (ROUTINE X 2)
Culture: NO GROWTH
Culture: NO GROWTH
Special Requests: ADEQUATE
Special Requests: ADEQUATE

## 2021-07-24 IMAGING — DX DG CHEST 1V PORT
1 series · 1 of 1 positions shown · non-contrast
Comparison: April 01, 2010

CLINICAL DATA: Shortness of breath

EXAM:
PORTABLE CHEST 1 VIEW

[chest ap]
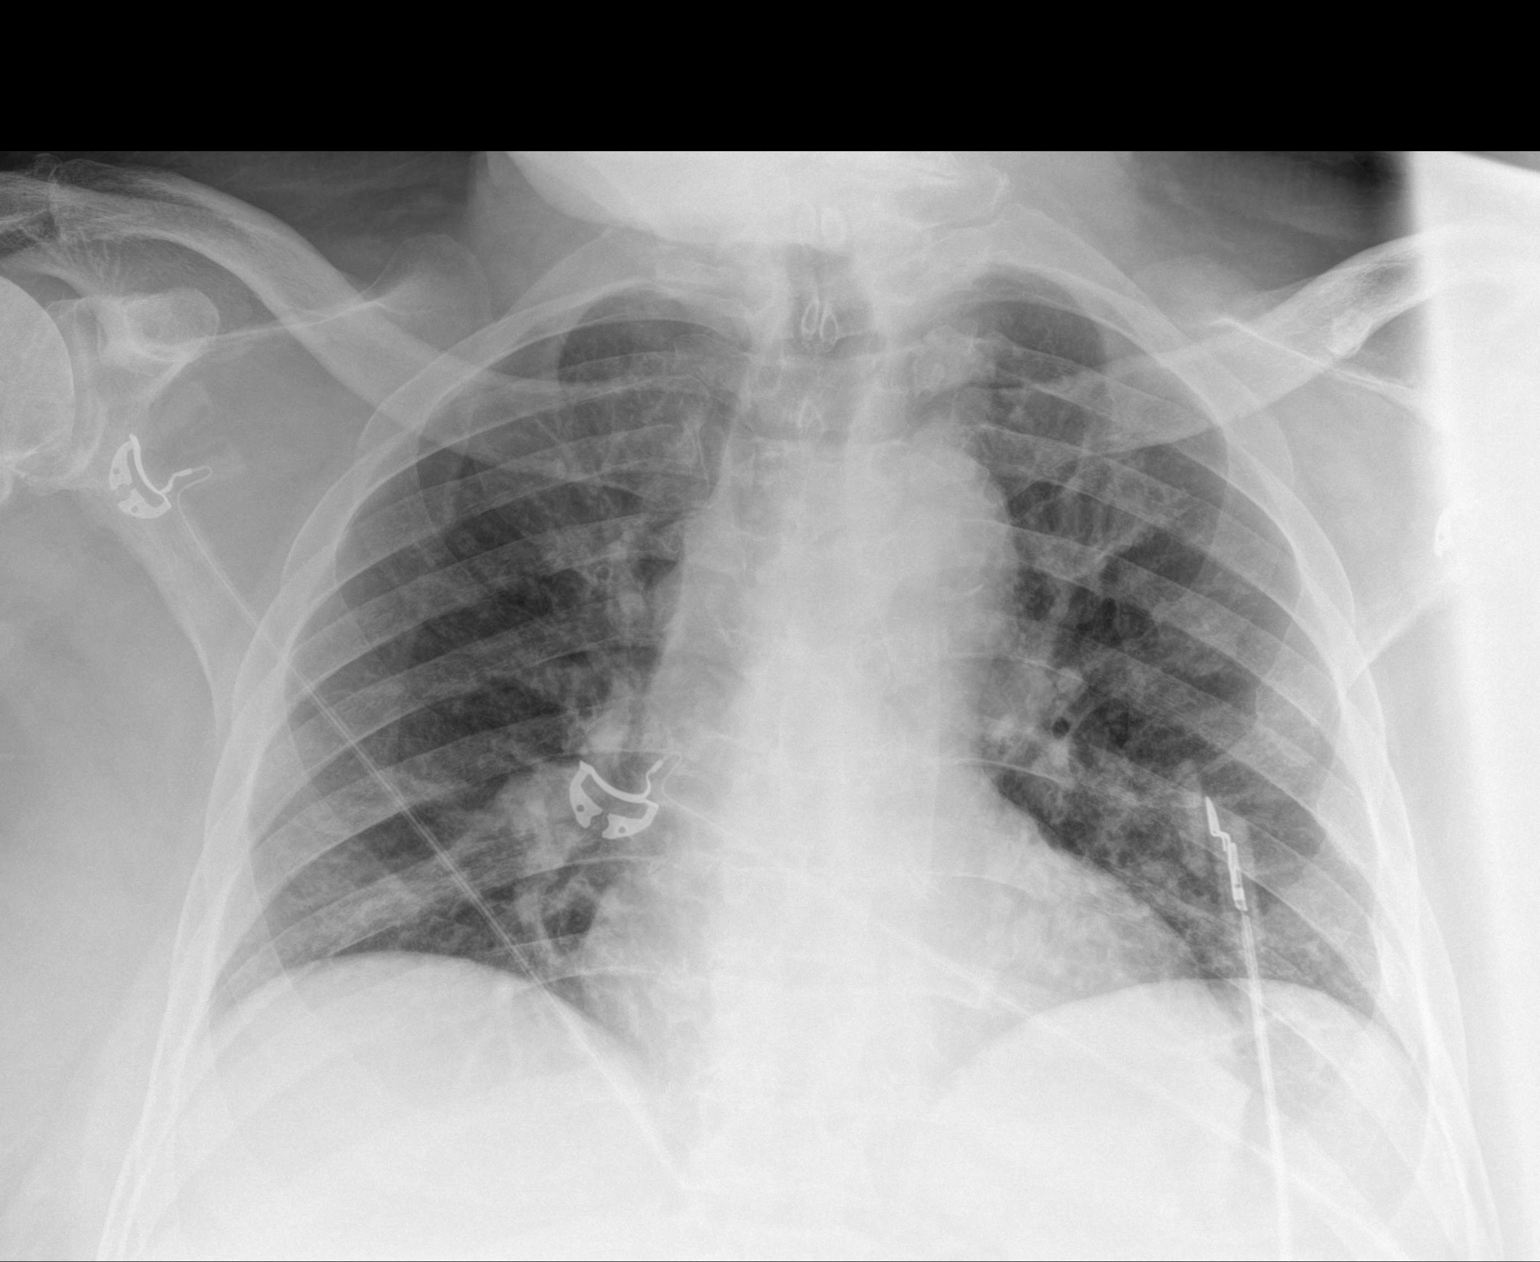

[1 of 1 positions shown; findings below may reference images not displayed]

FINDINGS: There is mild interstitial thickening in the mid and lower lung
regions. There is no edema or consolidation. Heart size and
pulmonary vascularity are normal. No adenopathy. No bone lesions.
IMPRESSION: Areas of mild interstitial thickening may represent a degree of
chronic bronchitis. No edema or airspace opacity. Cardiac silhouette
within normal limits.
